# Patient Record
Sex: Female | Born: 1960 | Race: Black or African American | Hispanic: No | Smoking: Current every day smoker
Health system: Southern US, Community
[De-identification: ages and names within clinical notes are randomized; demographics above are authoritative.]

## PROBLEM LIST (undated history)

## (undated) DIAGNOSIS — K5792 Diverticulitis of intestine, part unspecified, without perforation or abscess without bleeding: Secondary | ICD-10-CM

## (undated) DIAGNOSIS — I4891 Unspecified atrial fibrillation: Secondary | ICD-10-CM

## (undated) DIAGNOSIS — I10 Essential (primary) hypertension: Secondary | ICD-10-CM

---

## 2016-09-23 ENCOUNTER — Emergency Department (HOSPITAL_COMMUNITY)
Admission: EM | Admit: 2016-09-23 | Discharge: 2016-09-24 | Disposition: A | Discharge status: Home / Self Care | Payer: Self-pay | Attending: Emergency Medicine | Admitting: Emergency Medicine

## 2016-09-23 ENCOUNTER — Encounter (HOSPITAL_COMMUNITY): Payer: Self-pay | Admitting: *Deleted

## 2016-09-23 ENCOUNTER — Emergency Department (HOSPITAL_COMMUNITY): Payer: Self-pay

## 2016-09-23 DIAGNOSIS — F17210 Nicotine dependence, cigarettes, uncomplicated: Secondary | ICD-10-CM | POA: Insufficient documentation

## 2016-09-23 DIAGNOSIS — I10 Essential (primary) hypertension: Secondary | ICD-10-CM | POA: Insufficient documentation

## 2016-09-23 DIAGNOSIS — Z91040 Latex allergy status: Secondary | ICD-10-CM | POA: Insufficient documentation

## 2016-09-23 DIAGNOSIS — M25551 Pain in right hip: Secondary | ICD-10-CM | POA: Insufficient documentation

## 2016-09-23 HISTORY — DX: Diverticulitis of intestine, part unspecified, without perforation or abscess without bleeding: K57.92

## 2016-09-23 HISTORY — DX: Unspecified atrial fibrillation: I48.91

## 2016-09-23 HISTORY — DX: Essential (primary) hypertension: I10

## 2016-09-23 LAB — CBG MONITORING, ED: Glucose-Capillary: 99 mg/dL (ref 65–99)

## 2016-09-23 MED ORDER — DIAZEPAM 5 MG/ML IJ SOLN
5.0000 mg | Freq: Once | INTRAMUSCULAR | Status: AC
  Administered 2016-09-23: 5 mg via INTRAMUSCULAR
  Filled 2016-09-23: qty 2

## 2016-09-23 MED ORDER — DEXAMETHASONE SODIUM PHOSPHATE 10 MG/ML IJ SOLN
10.0000 mg | Freq: Once | INTRAMUSCULAR | Status: AC
  Administered 2016-09-23: 10 mg via INTRAMUSCULAR
  Filled 2016-09-23: qty 1

## 2016-09-23 MED ORDER — KETOROLAC TROMETHAMINE 60 MG/2ML IM SOLN
60.0000 mg | Freq: Once | INTRAMUSCULAR | Status: AC
  Administered 2016-09-24: 60 mg via INTRAMUSCULAR
  Filled 2016-09-23: qty 2

## 2016-09-23 NOTE — ED Notes (Signed)
Pt states right groin pain for the past 2 days. Denies any injury or no activity.

## 2016-09-23 NOTE — ED Triage Notes (Signed)
Pt with right hip pain, denies any injury to area.  Denies burning on urination.

## 2016-09-23 NOTE — ED Provider Notes (Signed)
[facility]EMERGENCY DEPT Provider Note   CSN: [id number] Arrival date & time: 09/23/16  2251 By signing my name below, I, Linus GalasMaharshi Patel, attest that this documentation has been prepared under the direction and in the presence of Devoria AlbeIva Nysir Fergusson, MD. Electronically Signed: Linus GalasMaharshi Patel, ED Scribe. 09/23/16. 11:19 PM.  Time seen 23:15 PM  History   Chief Complaint Chief Complaint  Patient presents with  . Groin Pain   The history is provided by the patient. No language interpreter was used.   HPI Comments: Annette Martinez is a 55 y.o. female who presents to the Emergency Department with a PMHx of atrial fibrillation, diverticulitis, HTN, and arthritis in her knees complaining of sudden, constant throbbing right hip pain that began Tuesday morning, 2 days ago. Pt states her pain is aggravated with weight bearing and when she bends her knee. Pt reports similar pain 25 years ago during her pregnancy. Pt denies any fevers, numbness, N/V/D, trouble urinating, urinary frequency, or any other symptoms at this time. She also has some pain in her back. She denies any injury she is aware of to precipitate this pain.  She does c/o bilateral knee pain, but states the pain in her right knee is worse.   Pt had a cortisone injection for her arthritic knee pain 2 years ago which provided relief.   [facility] [place]  Past Medical History:  Diagnosis Date  . A-fib (HCC)   . Diverticulitis   . Hypertension    There are no active problems to display for this patient.  Past Surgical History:  Procedure Laterality Date  . CESAREAN SECTION     OB History    No data available     Home Medications    Prior to Admission medications   Medication Sig Start Date End Date Taking? Authorizing Provider  cyclobenzaprine (FLEXERIL) 5 MG tablet Take 1 tablet (5 mg total) by mouth 3 (three) times daily as needed. 09/24/16   Devoria AlbeIva Trellis Guirguis, MD  naproxen (NAPROSYN) 500 MG tablet Take 1 po BID with food prn pain  09/24/16   Devoria AlbeIva Erica Richwine, MD   Family History History reviewed. No pertinent family history.  Social History Social History  Substance Use Topics  . Smoking status: Current Every Day Smoker    Types: Cigarettes  . Smokeless tobacco: Never Used  . Alcohol use Yes  Pt smokes 4 cigarettes a day.  Pt drinks alcohol twice a week.  Pt works 8 hour shifts in fast food.    Allergies   Latex  Review of Systems Review of Systems  Constitutional: Negative for fever.  Gastrointestinal: Negative for diarrhea, nausea and vomiting.  Genitourinary: Negative for difficulty urinating and frequency.  Musculoskeletal: Positive for arthralgias (right hip).  Neurological: Negative for numbness.  All other systems reviewed and are negative.  Physical Exam Updated Vital Signs BP 182/100 (BP Location: Left Arm)   Pulse 88   Temp 98.9 F (37.2 C) (Oral)   Resp 20   Ht 5' 5.5" (1.664 m)   Wt 265 lb (120.2 kg)   SpO2 99%   BMI 43.43 kg/m   Vital signs normal except for hypertension   Physical Exam  Constitutional: She is oriented to person, place, and time. She appears well-developed and well-nourished.  Non-toxic appearance. She does not appear ill. No distress.  HENT:  Head: Normocephalic and atraumatic.  Right Ear: External ear normal.  Left Ear: External ear normal.  Nose: Nose normal.  Mouth/Throat: Mucous membranes are normal.  Eyes: Conjunctivae and EOM are normal.  Neck: Normal range of motion and full passive range of motion without pain.  Cardiovascular: Normal rate.   Pulmonary/Chest: Effort normal. No respiratory distress. She has no rhonchi. She exhibits no crepitus.  Abdominal: Soft. Normal appearance and bowel sounds are normal. She exhibits no distension. There is no tenderness. There is no guarding.  Musculoskeletal: She exhibits no edema.       Legs: No TTP of the midline thoracic and lumbar spine. TTP over the SI joint. Very TTP over right groin, over true right hip  joint with pain upon attempted hip flexion. No pain over the sciatic notch.   Neurological: She is alert and oriented to person, place, and time. She has normal strength. No cranial nerve deficit.  Skin: Skin is warm, dry and intact. No rash noted. No erythema. No pallor.  Psychiatric: She has a normal mood and affect. Her speech is normal and behavior is normal. Her mood appears not anxious.  Nursing note and vitals reviewed.  ED Treatments / Results  DIAGNOSTIC STUDIES: Oxygen Saturation is 99% on room air, normal by my interpretation.    Labs (all labs ordered are listed, but only abnormal results are displayed) Results for orders placed or performed during the hospital encounter of 09/23/16  CBG monitoring, ED  Result Value Ref Range   Glucose-Capillary 99 65 - 99 mg/dL   Laboratory interpretation all normal    EKG  EKG Interpretation None      Radiology Dg Hip Unilat W Or Wo Pelvis 2-3 Views Right  Result Date: 09/24/2016 CLINICAL DATA:  55 y/o  F; 2 days of right groin pain. EXAM: DG HIP (WITH OR WITHOUT PELVIS) 2-3V RIGHT COMPARISON:  None. FINDINGS: There is no evidence of hip fracture or dislocation. Hip joints are well maintained. 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 IMPRESSION: No acute fracture or dislocation is identified. Electronically Signed   By: Mitzi HansenLance  Furusawa-Stratton M.D.   On: 09/24/2016 00:08    Procedures Procedures (including critical care time)  Medications Ordered in ED Medications  ketorolac (TORADOL) injection 60 mg (60 mg Intramuscular Given 09/24/16 0000)  dexamethasone (DECADRON) injection 10 mg (10 mg Intramuscular Given 09/23/16 2355)  diazepam (VALIUM) injection 5 mg (5 mg Intramuscular Given 09/23/16 2358)    Initial Impression / Assessment and Plan / ED Course  I have reviewed the triage vital signs and the nursing notes.  Pertinent labs & imaging results that were available during my  care of the patient were reviewed by me and considered in my medical decision making (see chart for details).  Clinical Course     COORDINATION OF CARE: 11:06 PM Discussed treatment plan with pt at bedside and pt agreed to plan.  12:40 AM Pt states her pain is improved after the injections. She is now sitting with her right knee flexed which she was unable to do before. Discussed her test results and follow upl    Final Clinical Impressions(s) / ED Diagnoses   Final diagnoses:  Right hip pain   New Prescriptions New Prescriptions   CYCLOBENZAPRINE (FLEXERIL) 5 MG TABLET    Take 1 tablet (5 mg total) by mouth 3 (three) times daily as needed.   NAPROXEN (NAPROSYN) 500 MG TABLET    Take 1 po BID with food prn pain   Plan discharge  Devoria AlbeIva Takeshi Teasdale, MD, FACEP   I personally performed the services described in this documentation, which was scribed in my  presence. The recorded information has been reviewed and considered.  Devoria Albe, MD, Concha Pyo, MD 09/24/16 [phone]

## 2016-09-24 MED ORDER — NAPROXEN 500 MG PO TABS: ORAL_TABLET | ORAL | 0 refills | Status: DC

## 2016-09-24 MED ORDER — CYCLOBENZAPRINE HCL 5 MG PO TABS: 5.0000 mg | ORAL_TABLET | Freq: Three times a day (TID) | ORAL | 0 refills | Status: DC | PRN

## 2016-09-24 NOTE — ED Notes (Signed)
Pt alert & oriented x4, stable gait. Patient given discharge instructions, paperwork & prescription(s). Patient  instructed to stop at the registration desk to finish any additional paperwork. Patient verbalized understanding. Pt left department w/ no further questions. 

## 2016-09-24 NOTE — Discharge Instructions (Signed)
Use ice and heat over the painful area. Take the medications as prescribed. [facility] if not improving over the next week. Return to the ED for fever.

## 2017-11-16 IMAGING — DX DG HIP (WITH OR WITHOUT PELVIS) 2-3V*R*
3 series · 3 of 3 positions shown · non-contrast
Comparison: None.

CLINICAL DATA: 55 y/o  F; 2 days of right groin pain.

EXAM:
DG HIP (WITH OR WITHOUT PELVIS) 2-3V RIGHT

[pelvis ap]
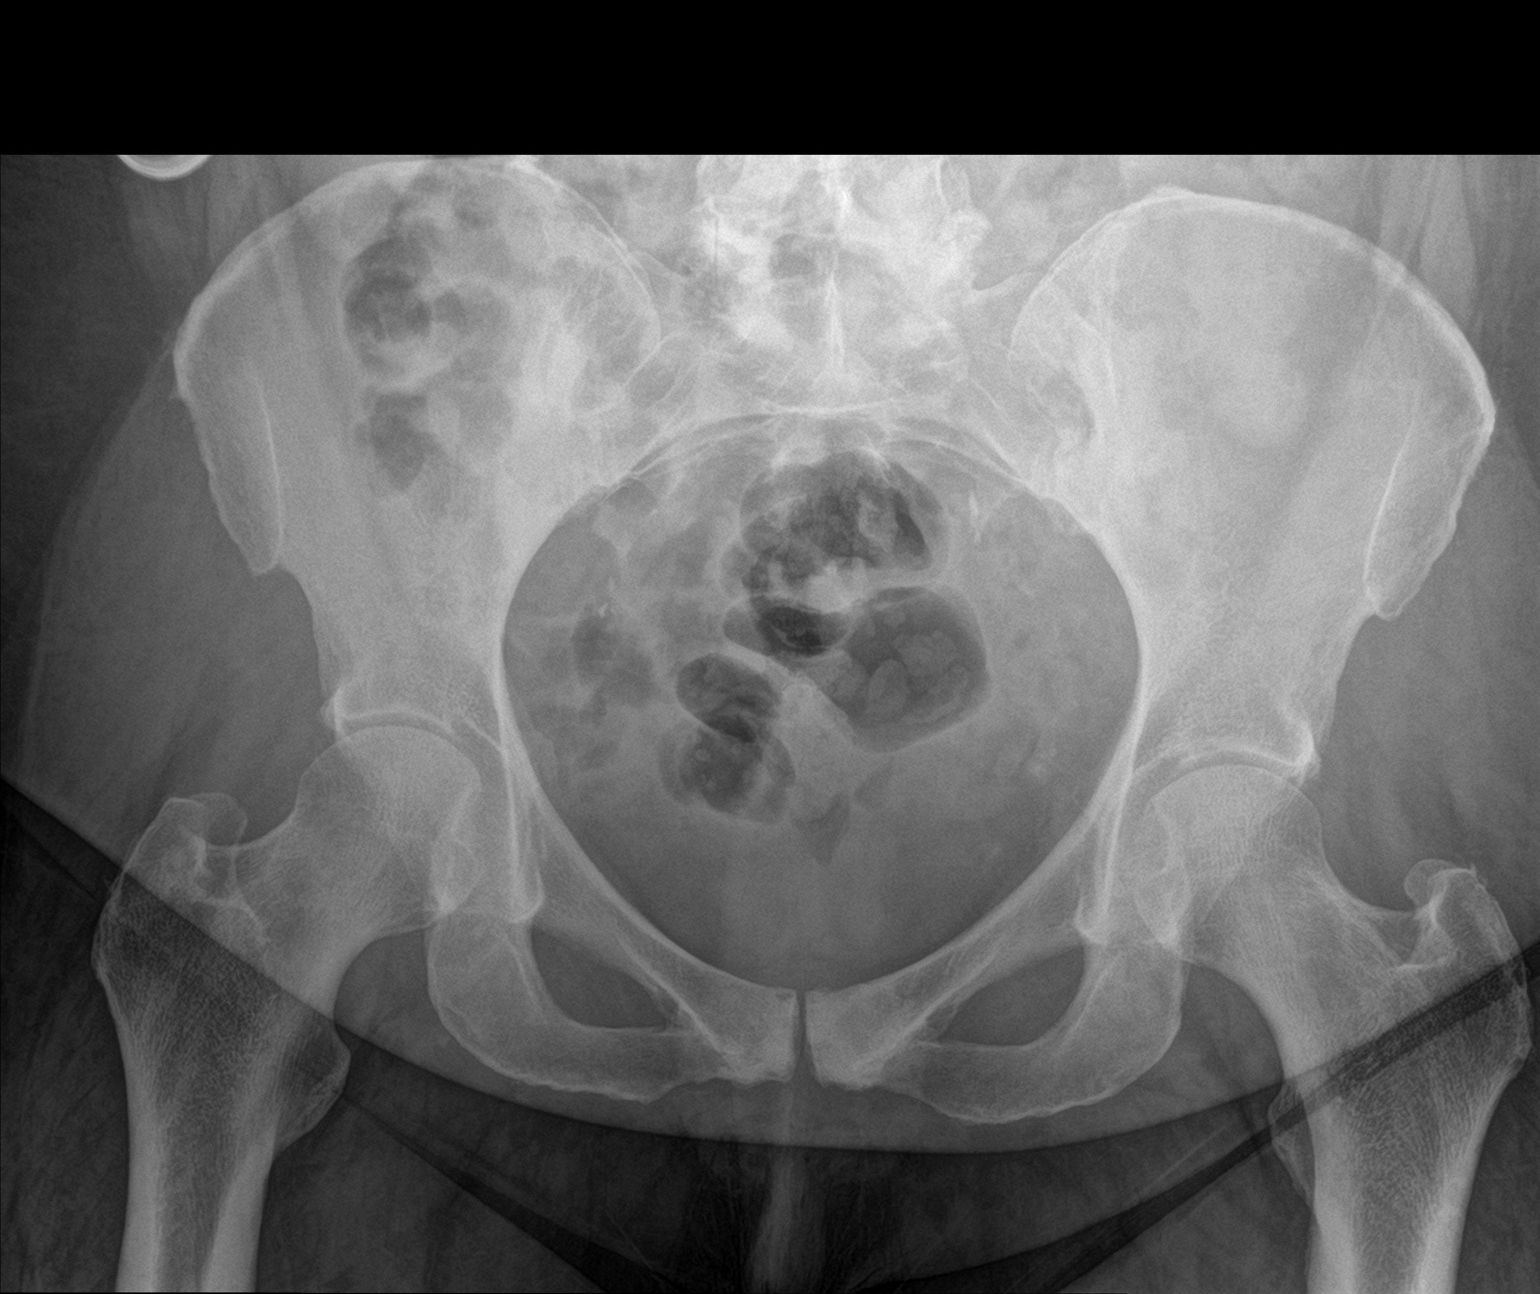

[hip ap]
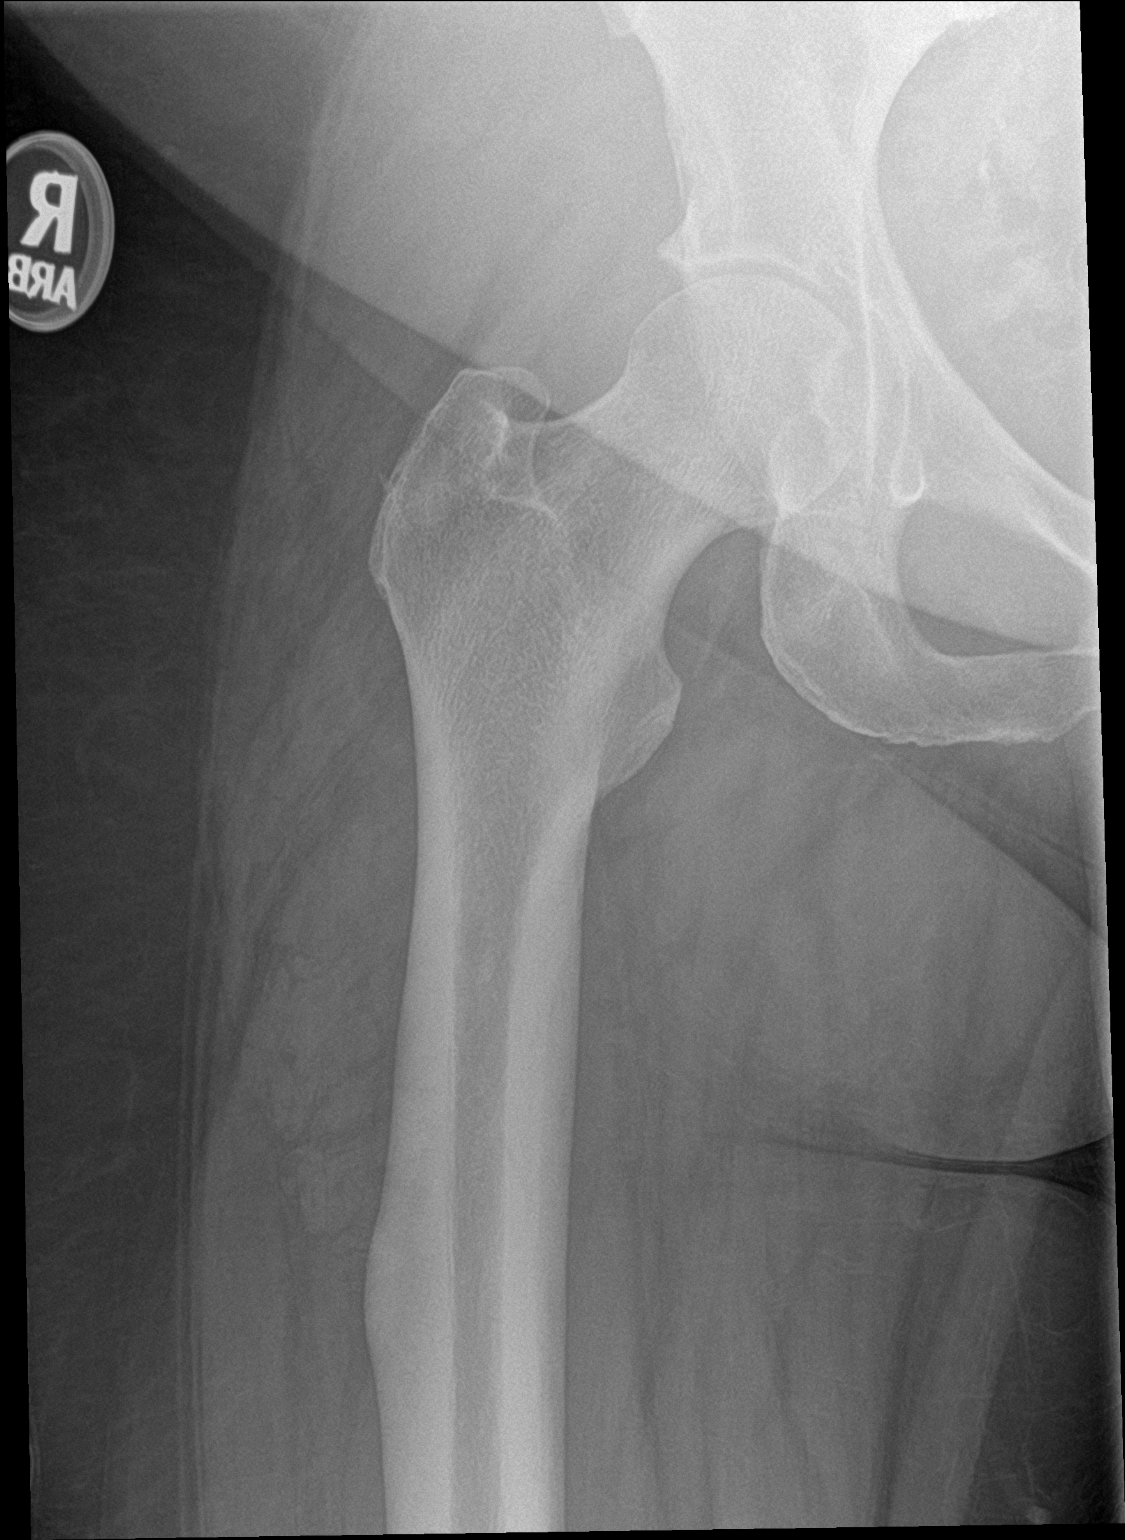

[hip lat]
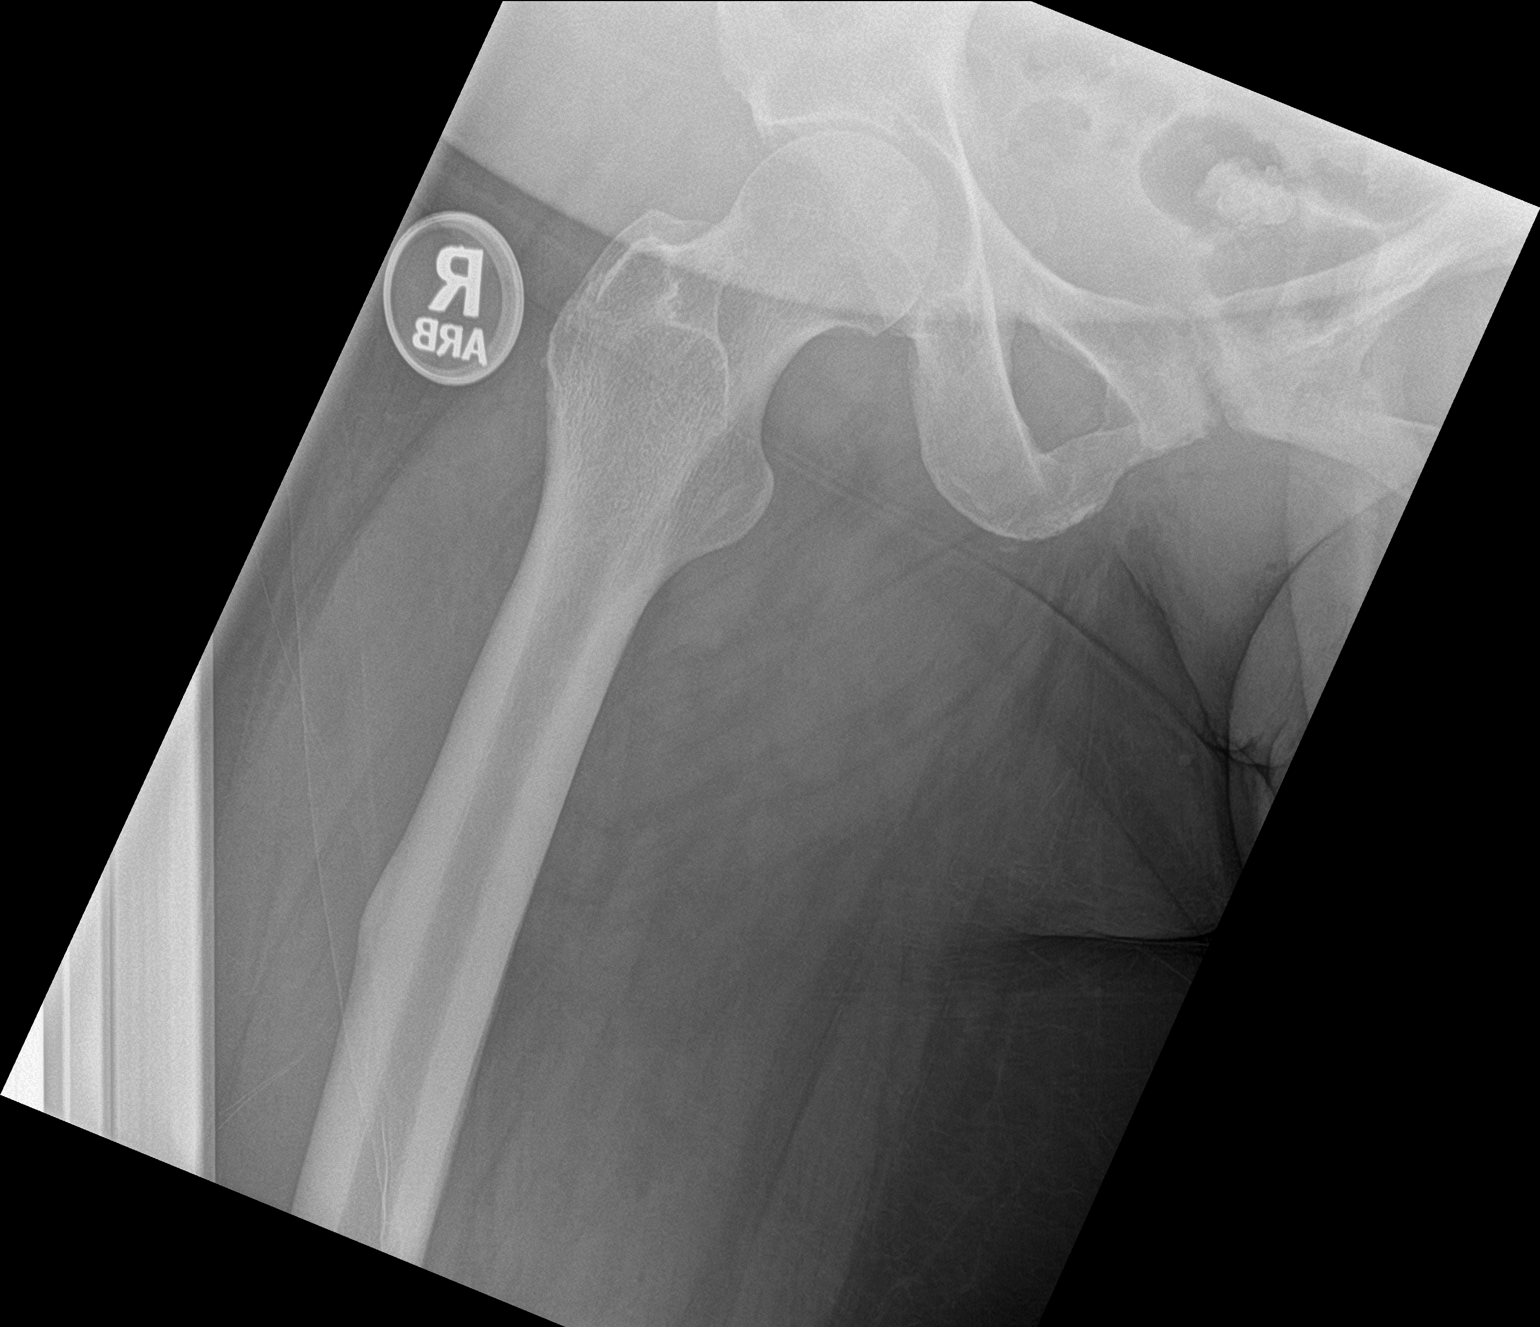

[3 of 3 positions shown; findings below may reference images not displayed]

FINDINGS: There is no evidence of hip fracture or dislocation. Hip joints are
well maintained. Pubic symphysis osteoarthrosis with irregularity of
articular surfaces and sclerosis.
IMPRESSION: No acute fracture or dislocation is identified.

By: Danuta Doee M.D.

## 2018-07-07 ENCOUNTER — Emergency Department (HOSPITAL_COMMUNITY)
Admission: EM | Admit: 2018-07-07 | Discharge: 2018-07-07 | Disposition: A | Discharge status: Home / Self Care | Payer: Self-pay | Attending: Emergency Medicine | Admitting: Emergency Medicine

## 2018-07-07 ENCOUNTER — Emergency Department (HOSPITAL_COMMUNITY): Payer: Self-pay

## 2018-07-07 ENCOUNTER — Encounter (HOSPITAL_COMMUNITY): Payer: Self-pay

## 2018-07-07 DIAGNOSIS — I10 Essential (primary) hypertension: Secondary | ICD-10-CM | POA: Insufficient documentation

## 2018-07-07 DIAGNOSIS — M25572 Pain in left ankle and joints of left foot: Secondary | ICD-10-CM | POA: Insufficient documentation

## 2018-07-07 DIAGNOSIS — G8929 Other chronic pain: Secondary | ICD-10-CM | POA: Insufficient documentation

## 2018-07-07 DIAGNOSIS — I4891 Unspecified atrial fibrillation: Secondary | ICD-10-CM | POA: Insufficient documentation

## 2018-07-07 DIAGNOSIS — M5432 Sciatica, left side: Secondary | ICD-10-CM

## 2018-07-07 DIAGNOSIS — F17210 Nicotine dependence, cigarettes, uncomplicated: Secondary | ICD-10-CM | POA: Insufficient documentation

## 2018-07-07 DIAGNOSIS — M5442 Lumbago with sciatica, left side: Secondary | ICD-10-CM | POA: Insufficient documentation

## 2018-07-07 MED ORDER — PREDNISONE 10 MG PO TABS: ORAL_TABLET | ORAL | 0 refills | Status: AC

## 2018-07-07 MED ORDER — CYCLOBENZAPRINE HCL 10 MG PO TABS: 10.0000 mg | ORAL_TABLET | Freq: Three times a day (TID) | ORAL | 0 refills | Status: AC | PRN

## 2018-07-07 MED ORDER — TRAMADOL HCL 50 MG PO TABS: 50.0000 mg | ORAL_TABLET | Freq: Four times a day (QID) | ORAL | 0 refills | Status: AC | PRN

## 2018-07-07 MED ORDER — ACETAMINOPHEN 325 MG PO TABS
650.0000 mg | ORAL_TABLET | Freq: Once | ORAL | Status: AC
  Administered 2018-07-07: 650 mg via ORAL
  Filled 2018-07-07: qty 2

## 2018-07-07 NOTE — ED Provider Notes (Signed)
[facility] EMERGENCY DEPARTMENT Provider Note   CSN: [id number] Arrival date & time: 07/07/18  4098     History   Chief Complaint Chief Complaint  Patient presents with  . Ankle Pain  . Back Pain    HPI Annette Martinez is a 57 y.o. female.  HPI   Annette Martinez is a 57 y.o. female who presents to the Emergency Department complaining of pain and swelling of her right medial ankle for 1 month.  She states that she walks and stands on hard floors all day at her job.  She describes a throbbing pain to her ankle that is nonradiating.  Pain is constant but worse with weightbearing.  No known injury.  She also complains of left sided low back pain 2 days ago.  She describes sharp stabbing type pain in her lower back that radiates to her left buttocks and hip.  Pain does not radiate into her lower leg.  She denies abdominal pain, urine or bowel changes, pain numbness or weakness of the lower extremities, fever or chills.  She is tried Tylenol without relief.  Her back pain improved somewhat when lying down.  She also states that she has been out of her hypertension and diabetes medications for some time.  She is awaiting for her Medicaid card to establish primary care.   Past Medical History:  Diagnosis Date  . A-fib (HCC)   . Diverticulitis   . Hypertension     There are no active problems to display for this patient.   Past Surgical History:  Procedure Laterality Date  . CESAREAN SECTION       OB History   None      Home Medications    Prior to Admission medications   Medication Sig Start Date End Date Taking? Authorizing Provider  cyclobenzaprine (FLEXERIL) 5 MG tablet Take 1 tablet (5 mg total) by mouth 3 (three) times daily as needed. 09/24/16   Devoria Albe, MD  naproxen (NAPROSYN) 500 MG tablet Take 1 po BID with food prn pain 09/24/16   Devoria Albe, MD    Family History No family history on file.  Social History Social History   Tobacco Use  . Smoking status:  Current Every Day Smoker    Types: Cigarettes  . Smokeless tobacco: Never Used  Substance Use Topics  . Alcohol use: Yes  . Drug use: Never     Allergies   Latex   Review of Systems Review of Systems  Constitutional: Negative for fever.  Respiratory: Negative for shortness of breath.   Gastrointestinal: Negative for abdominal pain, constipation and vomiting.  Genitourinary: Negative for decreased urine volume, difficulty urinating, dysuria, flank pain and hematuria.  Musculoskeletal: Positive for arthralgias (Right ankle pain) and back pain. Negative for joint swelling and neck pain.  Skin: Negative for color change and rash.  Neurological: Negative for weakness and numbness.     Physical Exam Updated Vital Signs BP (!) 161/82   Pulse 71   Temp 98 F (36.7 C) (Oral)   Resp 18   Ht 5\' 5"  (1.651 m)   Wt 110.2 kg   SpO2 97%   BMI 40.44 kg/m   Physical Exam  Constitutional: She is oriented to person, place, and time. She appears well-developed and well-nourished. No distress.  HENT:  Head: Normocephalic and atraumatic.  Neck: Normal range of motion. Neck supple.  Cardiovascular: Normal rate, regular rhythm and intact distal pulses.  DP pulses are strong and palpable bilaterally  Pulmonary/Chest:  Effort normal and breath sounds normal. No respiratory distress.  Abdominal: Soft. She exhibits no distension. There is no tenderness.  Musculoskeletal: She exhibits edema and tenderness.       Lumbar back: She exhibits tenderness and pain. She exhibits normal range of motion, no swelling, no deformity, no laceration and normal pulse.  ttp of the lower lumbar spine and left lumbar paraspinal muscles and SI joint.  Pt has 5/5 strength against resistance of bilateral lower extremities.  Positive straight leg raise on the left at 20 degrees.  Mild edema and tenderness to palpation of the medial aspect of the left ankle.  No excessive warmth or erythema.  No open wound or skin  changes.  Neurological: She is alert and oriented to person, place, and time. She has normal strength. No sensory deficit. She exhibits normal muscle tone. Coordination and gait normal.  Reflex Scores:      Patellar reflexes are 2+ on the right side and 2+ on the left side.      Achilles reflexes are 2+ on the right side and 2+ on the left side. Skin: Skin is warm and dry. Capillary refill takes less than 2 seconds. No rash noted.  Nursing note and vitals reviewed.    ED Treatments / Results  Labs (all labs ordered are listed, but only abnormal results are displayed) Labs Reviewed - No data to display  EKG None  Radiology Dg Lumbar Spine Complete  Result Date: 07/07/2018 CLINICAL DATA:  Low back pain.  No injury. EXAM: LUMBAR SPINE - COMPLETE 4+ VIEW COMPARISON:  None. FINDINGS: No fracture, no bone lesion and no spondylolisthesis. Mild to moderate loss of disc height with endplate sclerosis and spurring at T11-T12. Mild loss of disc height at L4-L5 and L5-S1. Minor endplate spurring at L2, L3 and L4. There are facet degenerative changes bilaterally at L4-L5 and L5-S1. Scattered arterial vascular calcifications. Soft tissues otherwise unremarkable. IMPRESSION: 1. No fracture, spondylolisthesis or acute finding. 2. Mild degenerative changes. Electronically Signed   By: Amie Portland M.D.   On: 07/07/2018 11:50   Dg Ankle Complete Right  Result Date: 07/07/2018 CLINICAL DATA:  Swelling and right ankle pain. EXAM: RIGHT ANKLE - COMPLETE 3+ VIEW COMPARISON:  No recent prior. FINDINGS: Diffuse soft tissue swelling. Tiny corticated bony densities noted adjacent to the medial and lateral malleoli most likely old fracture fragments. No other acute bony abnormalities identified. Calcaneal spurring. Diffuse degenerative change. IMPRESSION: Number tiny corticated bony densities noted adjacent to the medial and lateral malleoli consistent with tiny fractures, most likely old. 2.  Diffuse soft tissue  swelling. Electronically Signed   By: Maisie Fus  Register   On: 07/07/2018 11:47     Procedures Procedures (including critical care time)  Medications Ordered in ED Medications  acetaminophen (TYLENOL) tablet 650 mg (has no administration in time range)     Initial Impression / Assessment and Plan / ED Course  I have reviewed the triage vital signs and the nursing notes.  Pertinent labs & imaging results that were available during my care of the patient were reviewed by me and considered in my medical decision making (see chart for details).     Pt with likely sciatica of the left low back.  Ambulates with slow but steady gait.  No focal neuro deficits, no concerning sx's for emergent neurological process.  Pt appears appropriate for d/c home, agrees to tx plan.  Return precautions discussed.   Final Clinical Impressions(s) / ED Diagnoses   Final  diagnoses:  Sciatica of left side  Chronic pain of left ankle    ED Discharge Orders    None       Pauline Aus, PA-C 07/09/18 2056    Samuel Jester, DO 07/11/18 [phone]

## 2018-07-07 NOTE — Discharge Instructions (Addendum)
Alternate ice and heat to your back.  Wear the ankle brace as needed for support.  Follow-up with your primary provider for recheck in a few days if the pain is not improving

## 2018-07-07 NOTE — ED Triage Notes (Signed)
Pt reports pain and swelling to r ankle for "months."  Denies injury.  Pt also c/o pain in left lower back that started a few days ago.  Denies injury or urinary symptoms.  Pain worse with movement.

## 2019-05-02 DEATH — deceased

## 2019-08-30 IMAGING — DX DG ANKLE COMPLETE 3+V*R*
3 series · 3 of 3 positions shown · non-contrast
Comparison: No recent prior.

CLINICAL DATA: Swelling and right ankle pain.

EXAM:
RIGHT ANKLE - COMPLETE 3+ VIEW

[ankle ap]
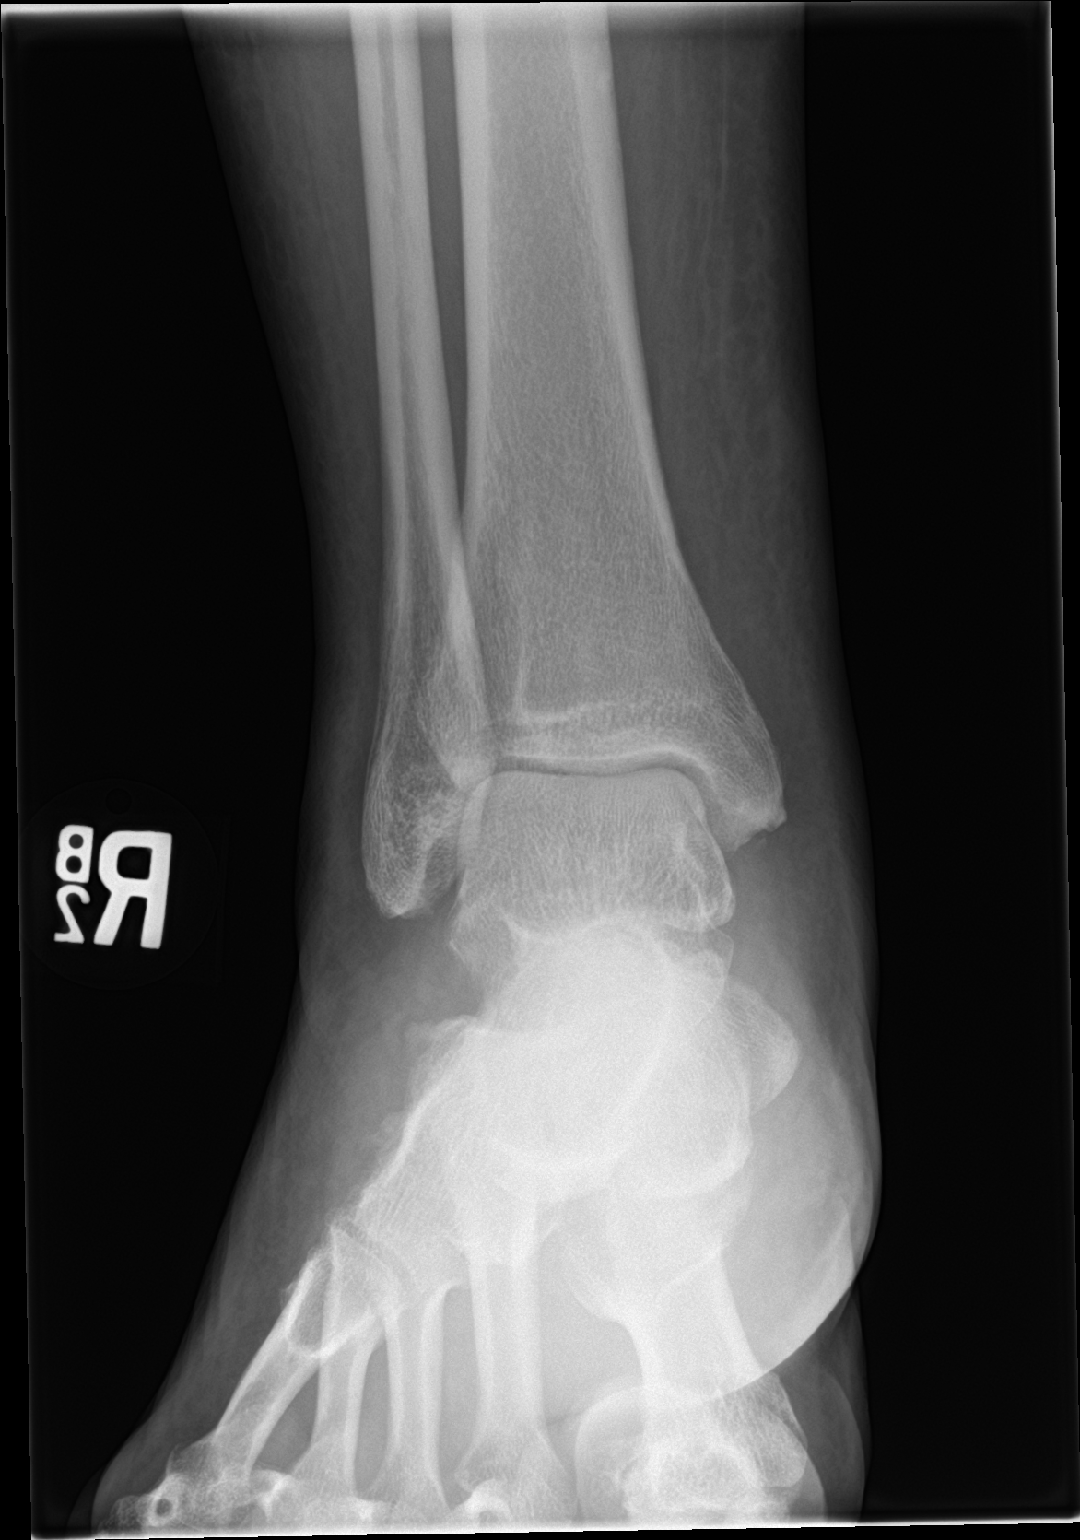

[ankle obl]
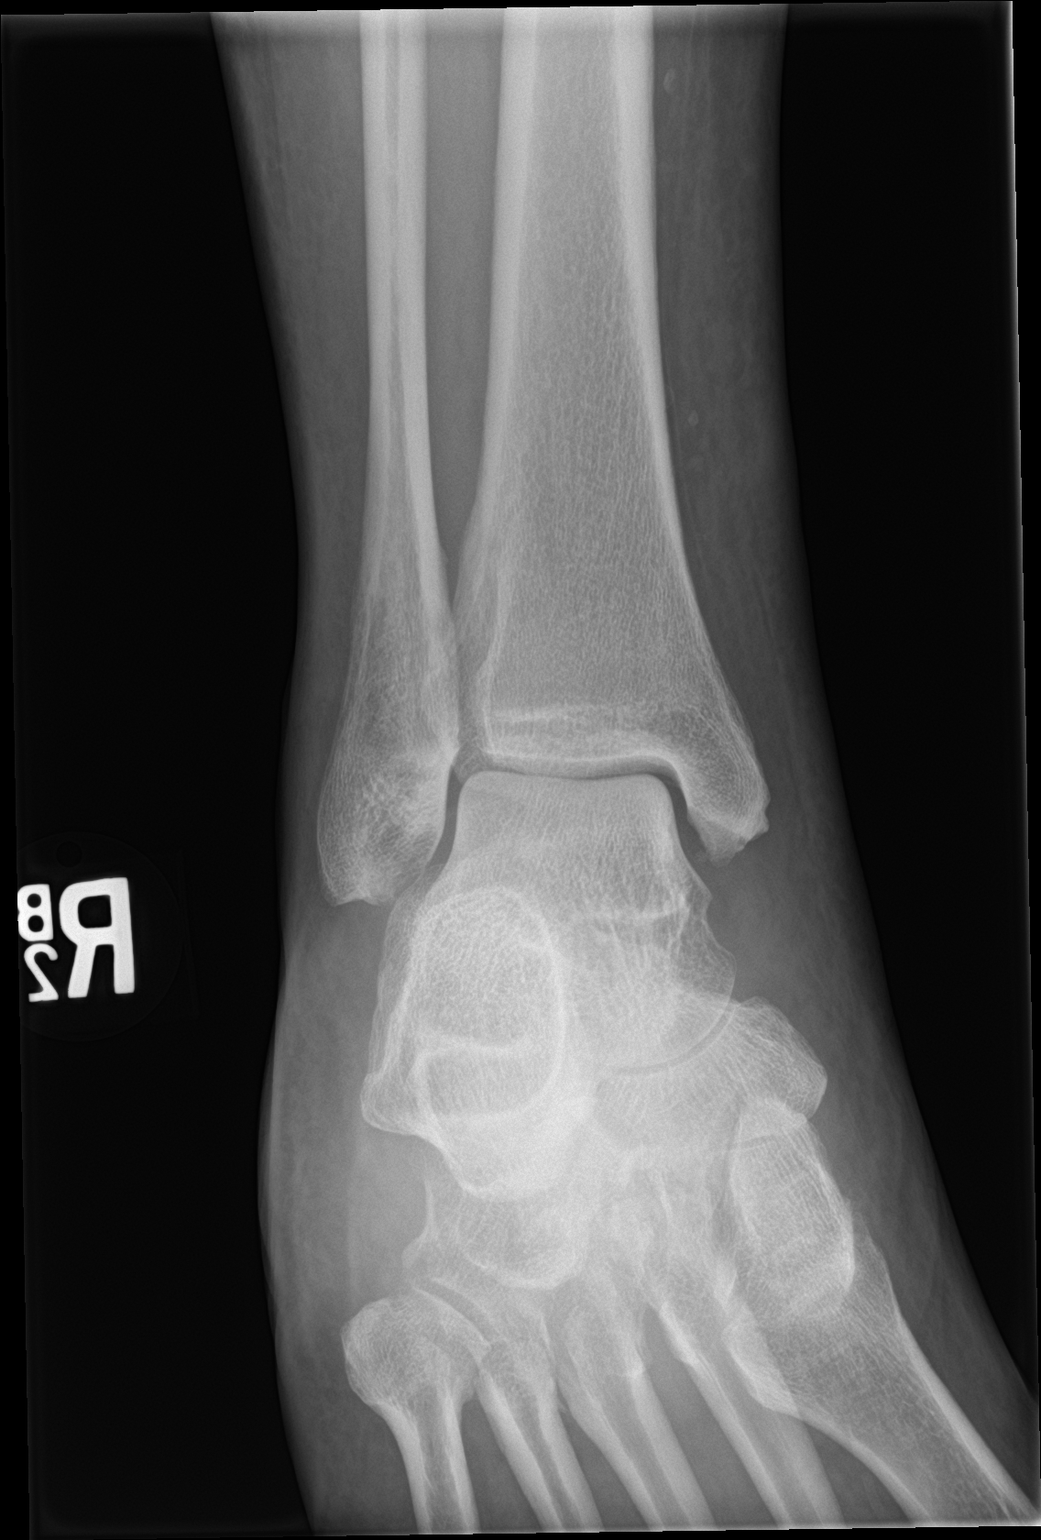

[ankle lat]
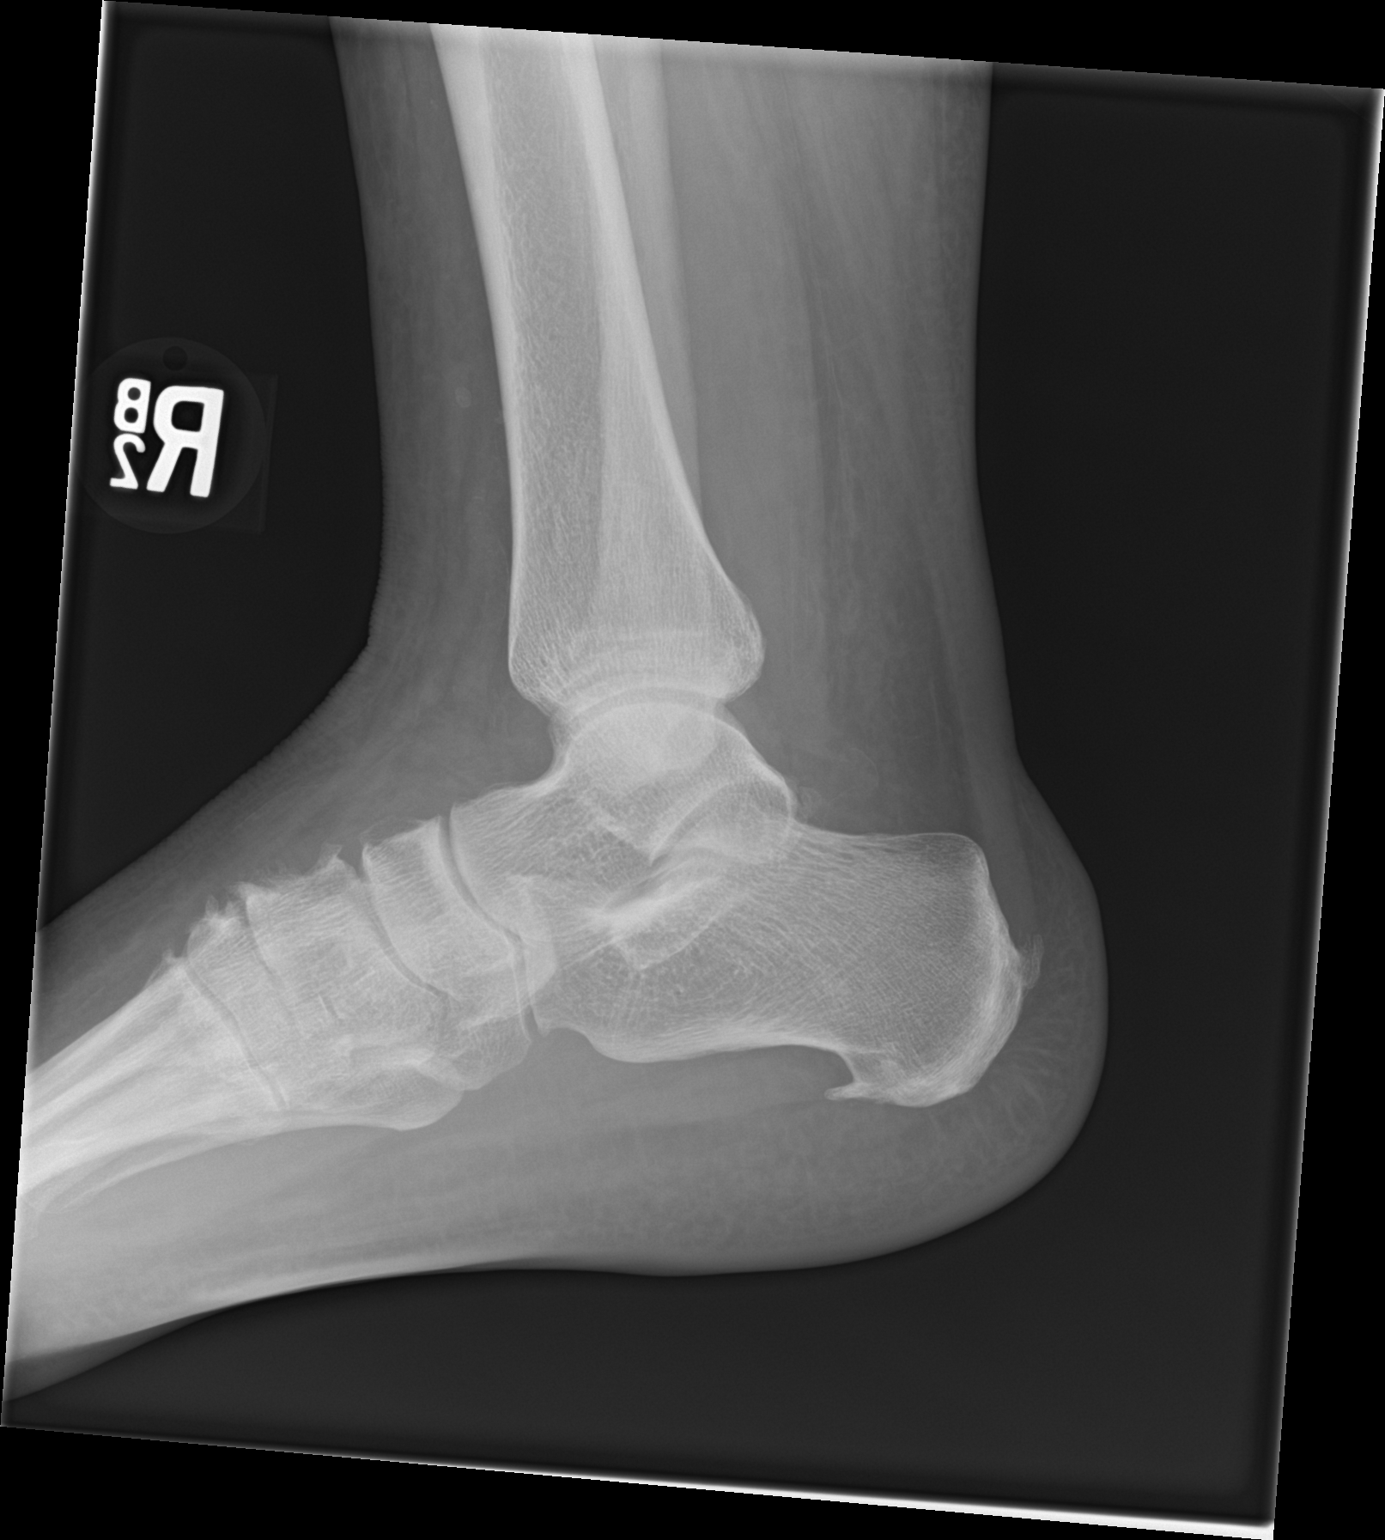

[3 of 3 positions shown; findings below may reference images not displayed]

FINDINGS: Diffuse soft tissue swelling. Tiny corticated bony densities noted
adjacent to the medial and lateral malleoli most likely old fracture
fragments. No other acute bony abnormalities identified. Calcaneal
spurring. Diffuse degenerative change.
IMPRESSION: Number tiny corticated bony densities noted adjacent to the medial
and lateral malleoli consistent with tiny fractures, most likely
old.

2.  Diffuse soft tissue swelling.
